# Patient Record
Sex: Male | Born: 1980 | Race: Black or African American | Hispanic: No | Marital: Single | Smoking: Current every day smoker
Health system: Southern US, Community
[De-identification: ages and names within clinical notes are randomized; demographics above are authoritative.]

---

## 2018-12-08 ENCOUNTER — Other Ambulatory Visit: Payer: Self-pay | Admitting: Infectious Diseases

## 2018-12-08 DIAGNOSIS — K5720 Diverticulitis of large intestine with perforation and abscess without bleeding: Secondary | ICD-10-CM

## 2018-12-10 ENCOUNTER — Ambulatory Visit
Admission: RE | Admit: 2018-12-10 | Discharge: 2018-12-10 | Disposition: A | Discharge status: Home / Self Care | Payer: Self-pay | Source: Ambulatory Visit | Attending: Infectious Diseases | Admitting: Infectious Diseases

## 2018-12-10 ENCOUNTER — Other Ambulatory Visit: Payer: Self-pay | Admitting: Infectious Diseases

## 2018-12-10 ENCOUNTER — Other Ambulatory Visit: Payer: Self-pay

## 2018-12-10 DIAGNOSIS — K5720 Diverticulitis of large intestine with perforation and abscess without bleeding: Secondary | ICD-10-CM

## 2018-12-10 MED ORDER — IOPAMIDOL (ISOVUE-300) INJECTION 61%
100.0000 mL | Freq: Once | INTRAVENOUS | Status: AC | PRN
  Administered 2018-12-10: 100 mL via INTRAVENOUS

## 2018-12-10 NOTE — Progress Notes (Signed)
Referring Physician(s): Bhusal,Yogesh  Chief Complaint: The patient is seen in follow up today s/p diverticular abscess  History of present illness: Jason Gay is 38 year old male with past medical history of recurrent diverticulitis.  Patient is s/p drain placement 2/24 for acute diverticulitis which was subsequently removed 10/31/18 after imaging showed resolve of fluid collection.  He returned to the ED several days later with return of symptoms and underwent LLQ drain placement 11/21/18.  He was discharged home on PO and IV abx. He presented to IR drain clinic today for drain evaluation.  Patient states he feels better since discharge.  He has been able to eat and drink per his usual.  He is having regular bowel movement. He states he initially had daily output which has decreased significantly. He has not had any output for the past few days. He is otherwise without complaint today.   No past medical history on file.  Allergies: Patient has no allergy information on record.  Medications: Prior to Admission medications   Not on File     No family history on file.  Social History   Socioeconomic History  . Marital status: Single    Spouse name: Not on file  . Number of children: Not on file  . Years of education: Not on file  . Highest education level: Not on file  Occupational History  . Not on file  Social Needs  . Financial resource strain: Not on file  . Food insecurity:    Worry: Not on file    Inability: Not on file  . Transportation needs:    Medical: Not on file    Non-medical: Not on file  Tobacco Use  . Smoking status: Not on file  Substance and Sexual Activity  . Alcohol use: Not on file  . Drug use: Not on file  . Sexual activity: Not on file  Lifestyle  . Physical activity:    Days per week: Not on file    Minutes per session: Not on file  . Stress: Not on file  Relationships  . Social connections:    Talks on phone: Not on file    Gets  together: Not on file    Attends religious service: Not on file    Active member of club or organization: Not on file    Attends meetings of clubs or organizations: Not on file    Relationship status: Not on file  Other Topics Concern  . Not on file  Social History Narrative  . Not on file     Vital Signs: BP 132/81   Pulse 74   Temp 98.6 F (37 C)   SpO2 100%   Physical Exam  NAD, alert, anxious Abdomen: LLQ drain in place. Insertion site c/d/i.  No output in bulb.   Imaging: No results found.  Labs:  CBC: No results for input(s): WBC, HGB, HCT, PLT in the last 8760 hours.  COAGS: No results for input(s): INR, APTT in the last 8760 hours.  BMP: No results for input(s): NA, K, CL, CO2, GLUCOSE, BUN, CALCIUM, CREATININE, GFRNONAA, GFRAA in the last 8760 hours.  Invalid input(s): CMP  LIVER FUNCTION TESTS: No results for input(s): BILITOT, AST, ALT, ALKPHOS, PROT, ALBUMIN in the last 8760 hours.  Assessment: Diverticular abscess s/p LLQ drain placement 4/10 Patient presents to IR drain clinic today for evaluation of his LLQ drain.  Report initially had several mLs of drainage daily, now with no output for the past  several days.  CT Abdomen/Pelvis performed today and reviewed by Dr. Deanne CofferHassell who notes continued collection.  Tubing irrigated by PA today with return of purulent material.  Patient educated on flushing which was demonstrated by PA today. Given prescription and instructions to continue flushing BID per Dr. Deanne CofferHassell.  Patient to return in 3 weeks for repeat CT imaging with possible injection.  Patient verbalizes understanding.  He is encouraged to keep all follow-up appointment.  He believes he may have an appointment with surgeon at [facility] for possible colectomy in the future.   Signed: Hoyt KochKacie Sue-Ellen Aerik Polan, PA 12/10/2018, 11:52 AM   Please refer to Dr. Deanne CofferHassell attestation of this note for management and plan.

## 2018-12-31 ENCOUNTER — Other Ambulatory Visit: Payer: Self-pay

## 2018-12-31 ENCOUNTER — Encounter: Payer: Self-pay | Admitting: Radiology

## 2018-12-31 ENCOUNTER — Ambulatory Visit
Admission: RE | Admit: 2018-12-31 | Discharge: 2018-12-31 | Disposition: A | Discharge status: Home / Self Care | Payer: Self-pay | Source: Ambulatory Visit | Attending: Infectious Diseases | Admitting: Infectious Diseases

## 2018-12-31 DIAGNOSIS — K5720 Diverticulitis of large intestine with perforation and abscess without bleeding: Secondary | ICD-10-CM

## 2018-12-31 HISTORY — PX: IR RADIOLOGIST EVAL & MGMT: IMG5224

## 2018-12-31 MED ORDER — IOPAMIDOL (ISOVUE-300) INJECTION 61%
100.0000 mL | Freq: Once | INTRAVENOUS | Status: AC | PRN
  Administered 2018-12-31: 100 mL via INTRAVENOUS

## 2018-12-31 NOTE — Progress Notes (Signed)
Chief Complaint: F/U drain  Referring Physician(s): Bhusal,Yogesh  Supervising Physician: Gilmer Mor  History of Present Illness: Jason Gay is a 38 y.o. male with past medical history of recurrent diverticulitis.    He is s/p drain placement 2/24 for acute diverticulitis which was subsequently removed 10/31/18 after imaging showed resolve of fluid collection.    He returned to the ED several days later with return of symptoms and underwent LLQ drain placement 11/21/18.    He was seen by Dr. Rubye Oaks with General Surgery in [facility] who recommended a Hartman's procedure, however the patient refused this twice as he did not wish to have a colostomy.  He was discharged home on PO and IV abx.   He is waiting on a referral to Dr. Byrd Hesselbach at [facility] for definitive surgery. The hold up his his Medicaid approval.  He was seen in IR drain clinic on 4/29 and CT scan at that time showed persistent fluid collection.  He was instructed to continue flushing BID. He has been flushing only daily though because he states he "doesn't like the way it feels".  He is here today for another CT scan and drain injection.   No nausea/vomiting. No Fever/chills. ROS negative.  He is still taking antibiotics.  He is extremely jumpy and nervous and has a very low pain tolerance.  No past medical history on file.  Allergies: Patient has no allergy information on record.  Medications: Prior to Admission medications   Not on File     No family history on file.  Social History   Socioeconomic History  . Marital status: Single    Spouse name: Not on file  . Number of children: Not on file  . Years of education: Not on file  . Highest education level: Not on file  Occupational History  . Not on file  Social Needs  . Financial resource strain: Not on file  . Food insecurity:    Worry: Not on file    Inability: Not on file  . Transportation needs:    Medical: Not on file   Non-medical: Not on file  Tobacco Use  . Smoking status: Not on file  Substance and Sexual Activity  . Alcohol use: Not on file  . Drug use: Not on file  . Sexual activity: Not on file  Lifestyle  . Physical activity:    Days per week: Not on file    Minutes per session: Not on file  . Stress: Not on file  Relationships  . Social connections:    Talks on phone: Not on file    Gets together: Not on file    Attends religious service: Not on file    Active member of club or organization: Not on file    Attends meetings of clubs or organizations: Not on file    Relationship status: Not on file  Other Topics Concern  . Not on file  Social History Narrative  . Not on file     Review of Systems: A 12 point ROS discussed and pertinent positives are indicated in the HPI above.  All other systems are negative.  Review of Systems  Physical Exam Awake and alert NAD Abdomen soft Drain in place. Only  Minimal clearish output, appears to be c/w flush.    Imaging: Ct Abdomen Pelvis W Contrast  Result Date: 12/10/2018 CLINICAL DATA:  Diverticular abscess, post drain catheter placement 10/06/2018. After resolution of the abscess cavity on CT, and  no fistula on injection, the catheter was removed 10/31/2018. He returned 11/18/2018 with acute left pain, CT demonstrating reaccumulation of pericolonic left abscess. Drain catheter placed 11/21/2018. He presents for follow-up evaluation. EXAM: CT ABDOMEN AND PELVIS WITH CONTRAST TECHNIQUE: Multidetector CT imaging of the abdomen and pelvis was performed using the standard protocol following bolus administration of intravenous contrast. CONTRAST:  100mL ISOVUE-300 IOPAMIDOL (ISOVUE-300) INJECTION 61% COMPARISON:  11/18/2018 FINDINGS: Lower chest: No pleural or pericardial effusion. Visualized lung bases clear. Hepatobiliary: No focal liver abnormality is seen. No gallstones, gallbladder wall thickening, or biliary dilatation. Pancreas: Unremarkable.  No pancreatic ductal dilatation or surrounding inflammatory changes. Spleen: Normal in size without focal abnormality. Adrenals/Urinary Tract: Adrenal glands are unremarkable. Kidneys are normal, without renal calculi, focal lesion, or hydronephrosis. Bladder is unremarkable. Stomach/Bowel: Stomach and small bowel are nondistended. Normal appendix. The colon is nondilated. Multiple distal descending and sigmoid diverticula with regional inflammatory/edematous changes. Pigtail drain catheter in left psoas/pericolonic abscess containing gas and fluid, 2.4 cm diameter, previously 2.6. No new or undrained component. Vascular/Lymphatic: No significant vascular findings are present. No enlarged abdominal or pelvic lymph nodes. Reproductive: Prominent prostate. Other: No ascites. No free air. Musculoskeletal: No acute or significant osseous findings. IMPRESSION: 1. Sigmoid diverticulitis with some Interval decrease in size of left psoas/pericolonic abscess post percutaneous drain catheter placement. No new or undrained components. Electronically Signed   By: Corlis Leak  Hassell M.D.   On: 12/10/2018 13:29    Labs:  CBC: No results for input(s): WBC, HGB, HCT, PLT in the last 8760 hours.  COAGS: No results for input(s): INR, APTT in the last 8760 hours.  BMP: No results for input(s): NA, K, CL, CO2, GLUCOSE, BUN, CALCIUM, CREATININE, GFRNONAA, GFRAA in the last 8760 hours.  Invalid input(s): CMP  LIVER FUNCTION TESTS: No results for input(s): BILITOT, AST, ALT, ALKPHOS, PROT, ALBUMIN in the last 8760 hours.  TUMOR MARKERS: No results for input(s): AFPTM, CEA, CA199, CHROMGRNA in the last 8760 hours.  Assessment/Plan:  Diverticular abscess s/p LLQ drain placement 4/10.  Refused Hartmans procedure offered by Dr. Rubye OaksPalmer and is waiting on referral to Dr[facility]aters at [facility].  CT scan reviewed today showed resolution of the abscess cavity and drain injection showed NO fistula connection to the bowel.  Since  patient is waiting on Medicaid and does not have an appointment yet with Dr. Byrd HesselbachWaters, the decision was made to remove the drain today. It was removed without difficulty.  Again, the patient was very nervous and worried he had "internal bleeding" after the drain was removed.  I reassured the patient he would be fine. I educated him on signs and symptoms of recurrent abscess development and he understands. He will finish his antibiotics.  I instructed him he could remove the dressing in 24-48 hours and shower, cover with Band-Aid if needed.  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   Electronically Signed: Gwynneth MacleodWENDY S BLAIR PA-C 12/31/2018, 9:32 AM    Please refer to Dr. Kenna GilbertWagner's attestation of this note for management and plan.

## 2021-03-25 IMAGING — RF ABSCESS INJECTION
3 series · 9 of 9 positions shown · non-contrast
Comparison: none

INDICATION: 37-year-old male with a history of abscess secondary to
diverticulitis

[Series 1: sequence · 4 of 69 frames shown (1 of 2)]
[frame 1/69]
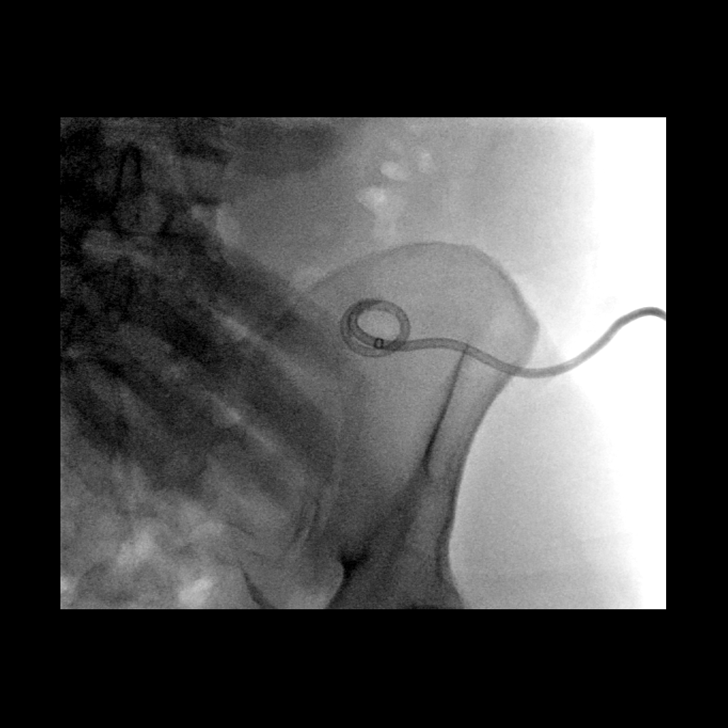
[frame 11/69]
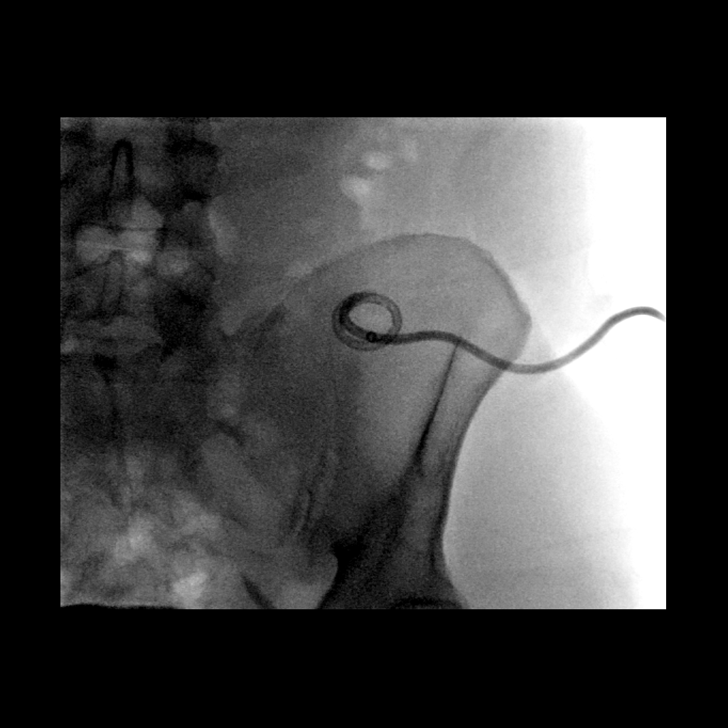
[frame 35/69]
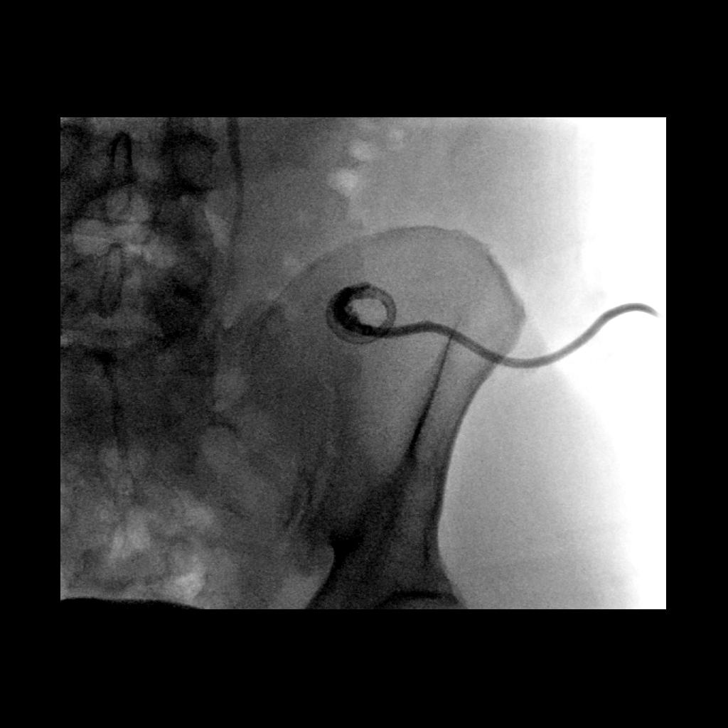
[frame 59/69]
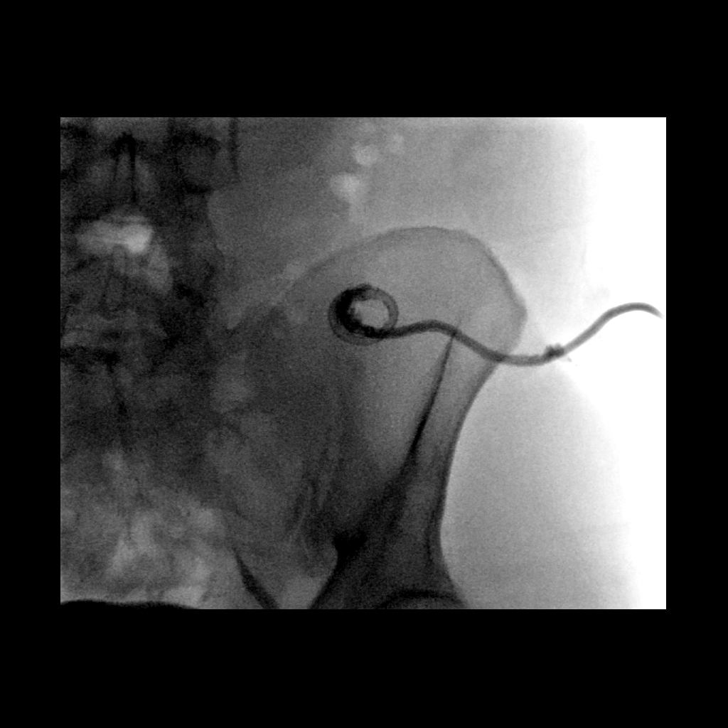

[Series 2: sequence · 4 of 21 frames shown (2 of 2)]
[frame 4/21]
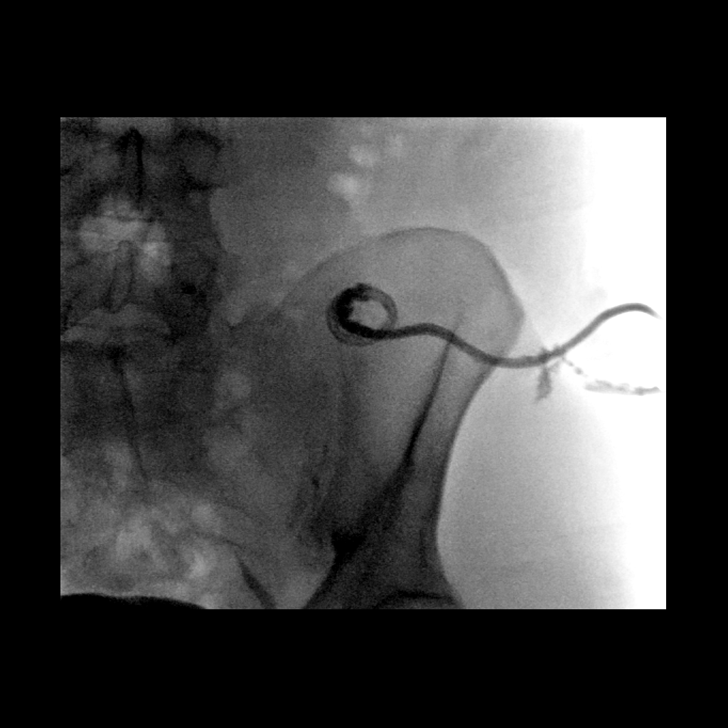
[frame 8/21]
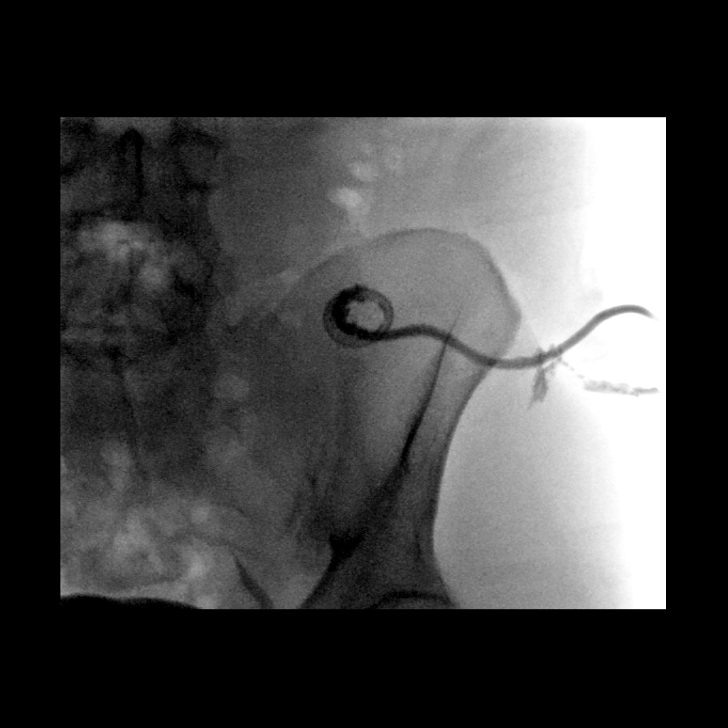
[frame 11/21]
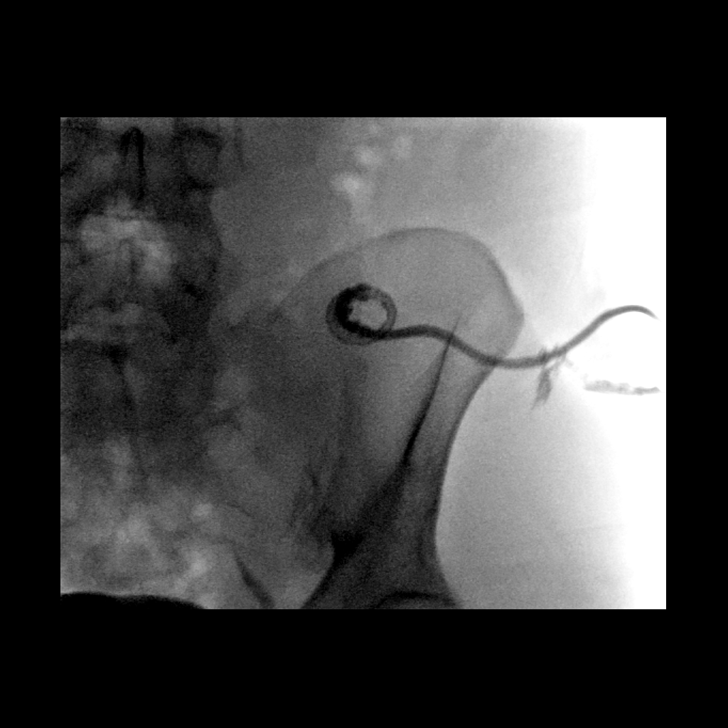
[frame 18/21]
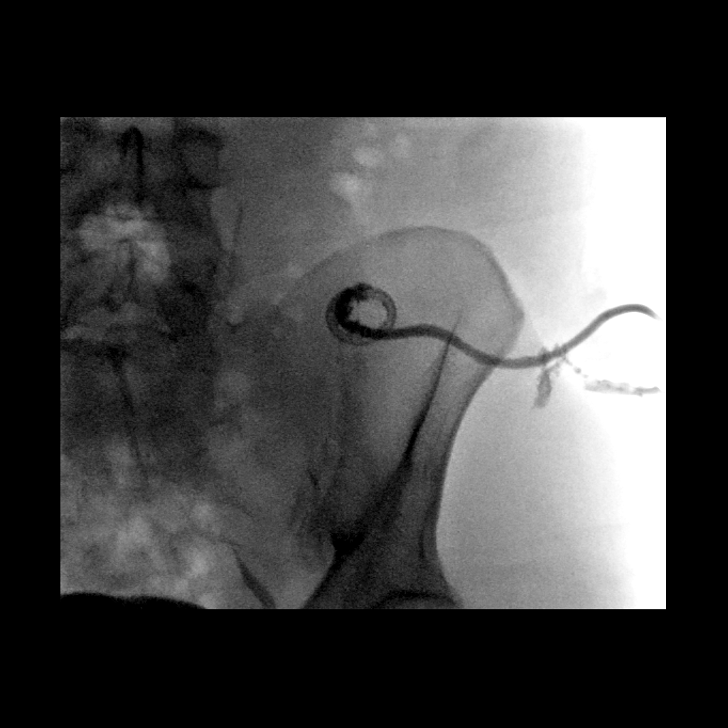

[Series 3: one shot · 0.15mm/px · 1 of 1 slices shown]
[im 1/1]
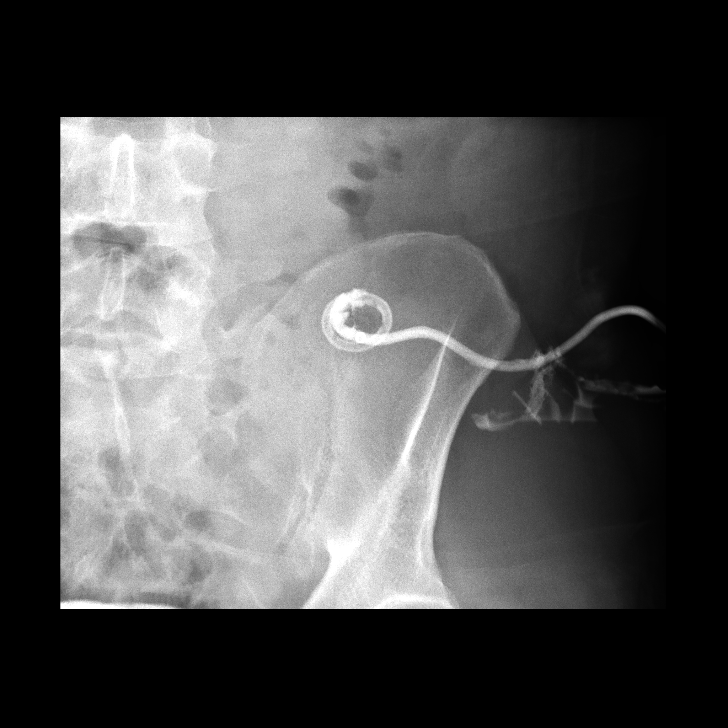

[9 of 9 positions shown; findings below may reference images not displayed]

EXAM:
IMAGE GUIDED DRAIN INJECTION/INTERROGATION

MEDICATIONS:
The patient is currently admitted to the hospital and receiving
intravenous antibiotics. The antibiotics were administered within an
appropriate time frame prior to the initiation of the procedure.

ANESTHESIA/SEDATION:
None

COMPLICATIONS:
None

PROCEDURE:
Informed written consent was obtained from the patient after a
thorough discussion of the procedural risks, benefits and
alternatives. All questions were addressed. Maximal Sterile Barrier
Technique was utilized including caps, mask, sterile gowns, sterile
gloves, sterile drape, hand hygiene and skin antiseptic. A timeout
was performed prior to the initiation of the procedure.

Patient positioned supine position on the fluoroscopy table. Scout
images were acquired.

Gentle injection was performed.

No residual abscess cavity with reflux of contrast through the skin.

No evidence of fistula to the colon.

Drain was removed at the bedside.
IMPRESSION: Drain injection of left pelvic abscess pigtail catheter demonstrates
no fistula and no residual abscess. Drain was removed.

## 2021-03-25 IMAGING — CT CT ABDOMEN AND PELVIS WITH CONTRAST
2 of 4 series · 14 of 46 positions shown, 16 images · IV contrast (iopamidol)
Comparison: None.

CLINICAL DATA: 37-year-old male with a history of diverticulitis,
with first episodes diagnosed in late [id number].
TECHNIQUE: Multidetector CT imaging of the abdomen and pelvis was performed
using the standard protocol following bolus administration of
intravenous contrast.

CONTRAST:  100mL [id number] IOPAMIDOL ([id number]) INJECTION 61%

[Series 2: abd pelvis 5.00 br40 s3 ax · axial · 0.66mm/px · z∈[+1038,+1408]mm · 11 of 90 slices shown, 13 images]
[im 8/90  soft-tissue]
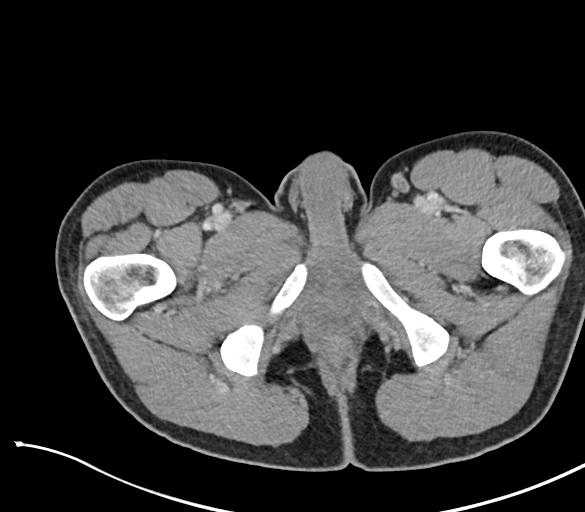
[im 8/90  bone]
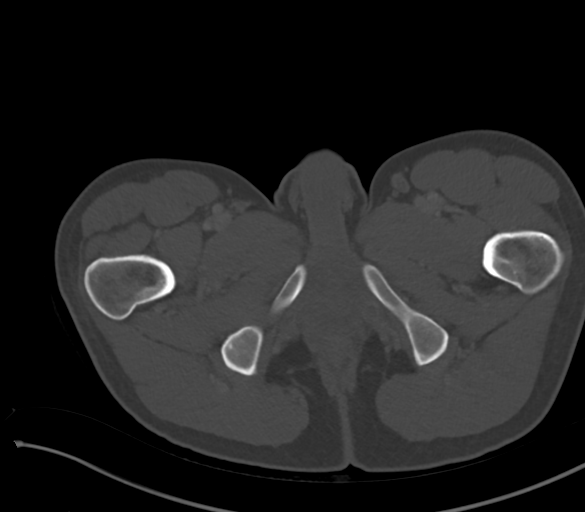
[im 15/90  soft-tissue]
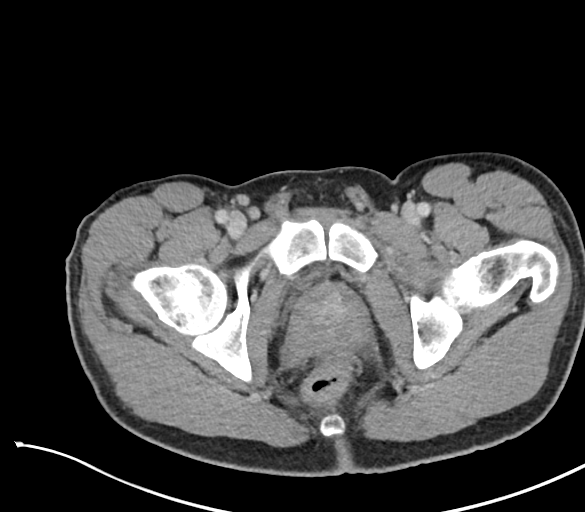
[im 22/90  soft-tissue]
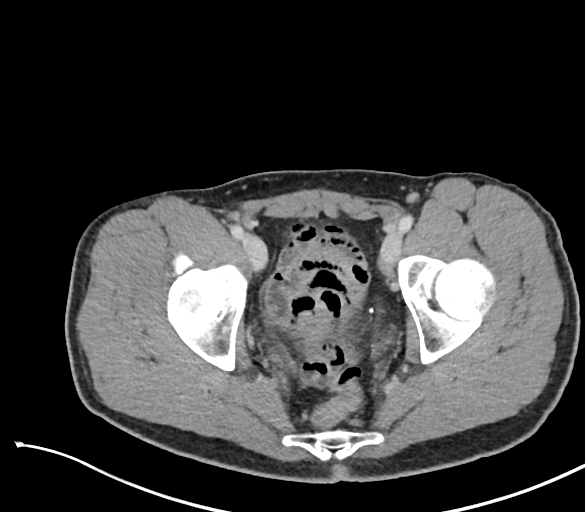
[im 29/90  soft-tissue]
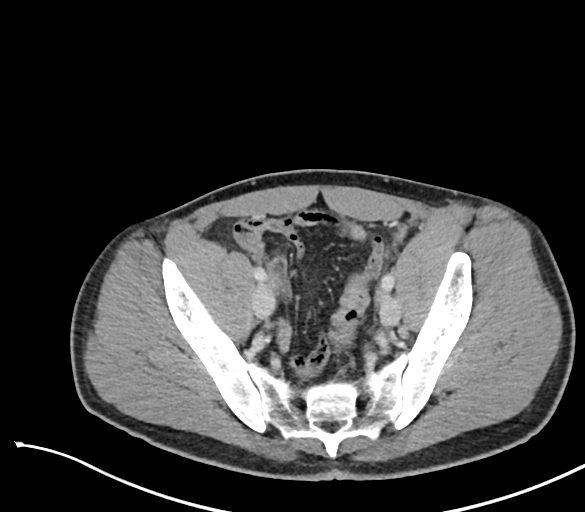
[im 36/90  soft-tissue]
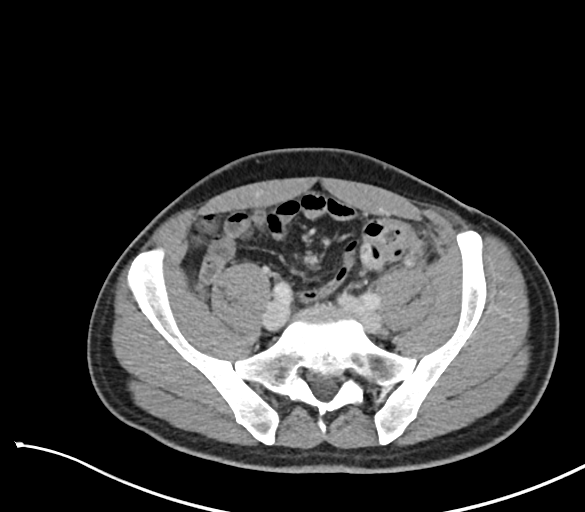
[im 47/90  soft-tissue]
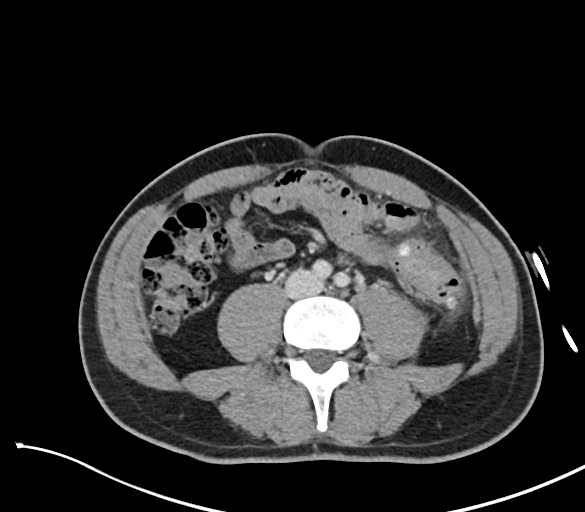
[im 54/90  soft-tissue]
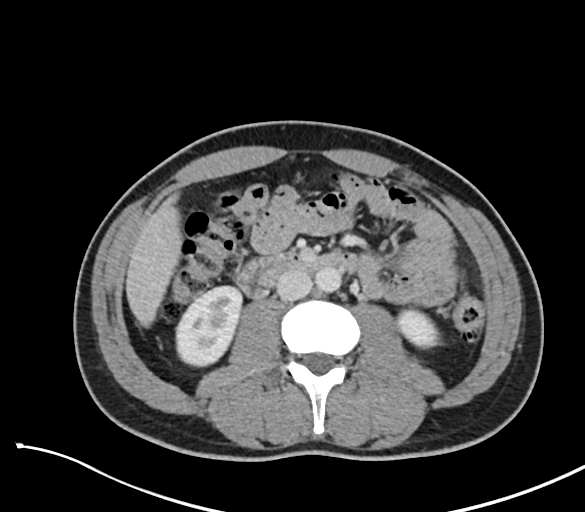
[im 61/90  soft-tissue]
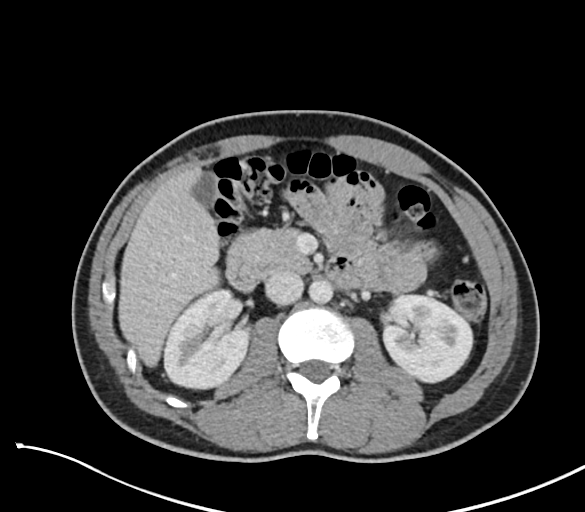
[im 68/90  soft-tissue]
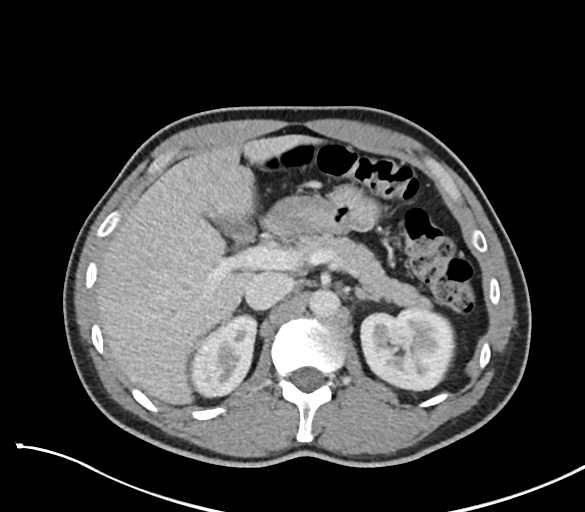
[im 68/90  bone]
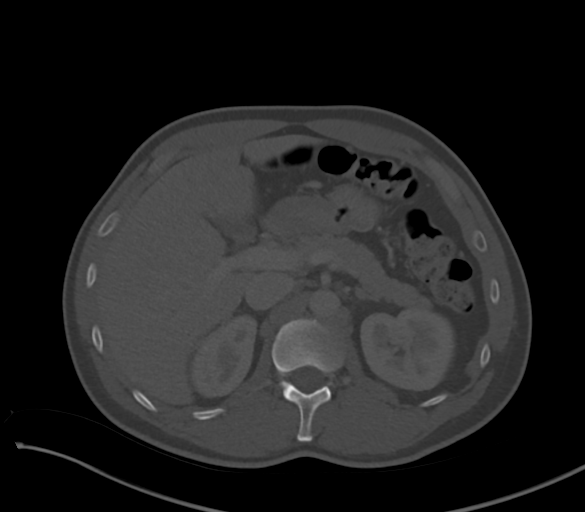
[im 75/90  soft-tissue]
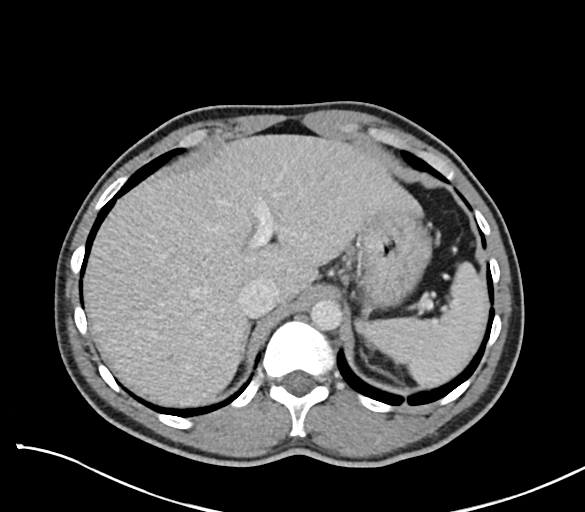
[im 82/90  soft-tissue]
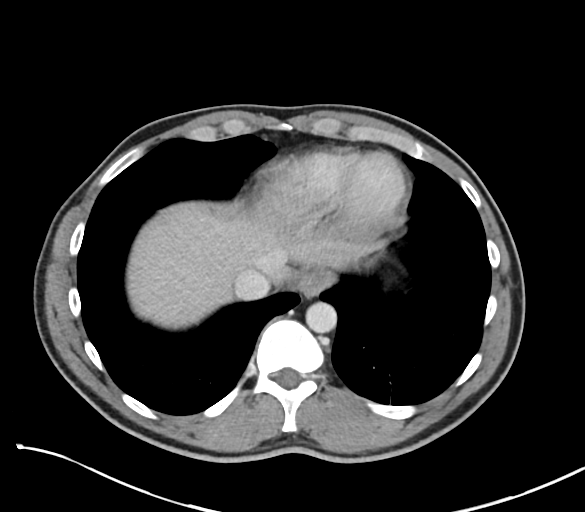

[Series 6: abd pelvis 2.00 br40 s3 cor · coronal · 0.71mm/px · 3 of 136 slices shown]
[im 46/136  soft-tissue]
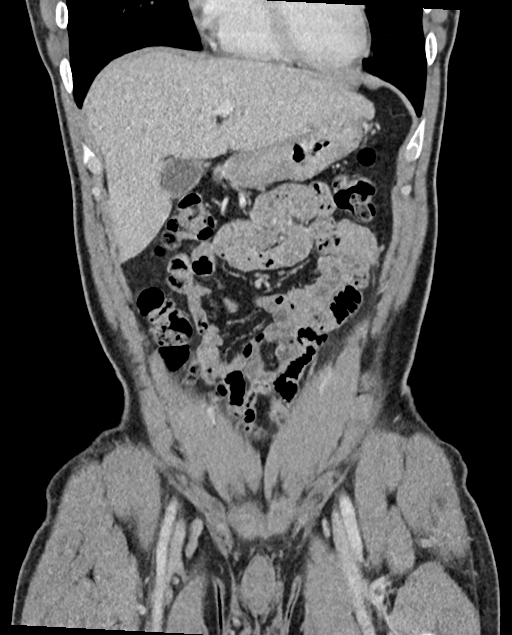
[im 61/136  soft-tissue]
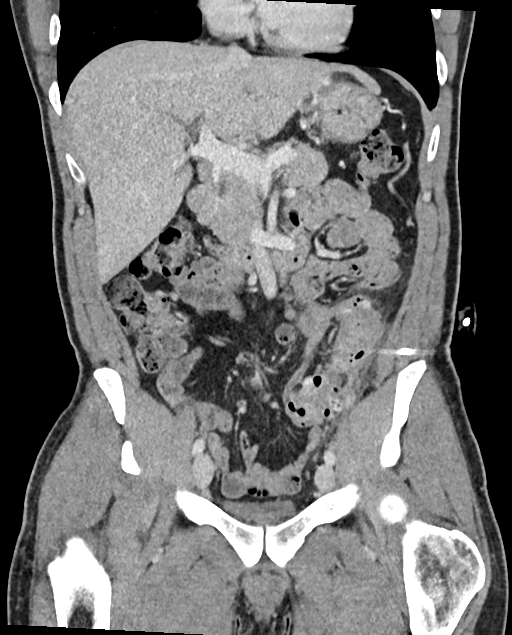
[im 76/136  soft-tissue]
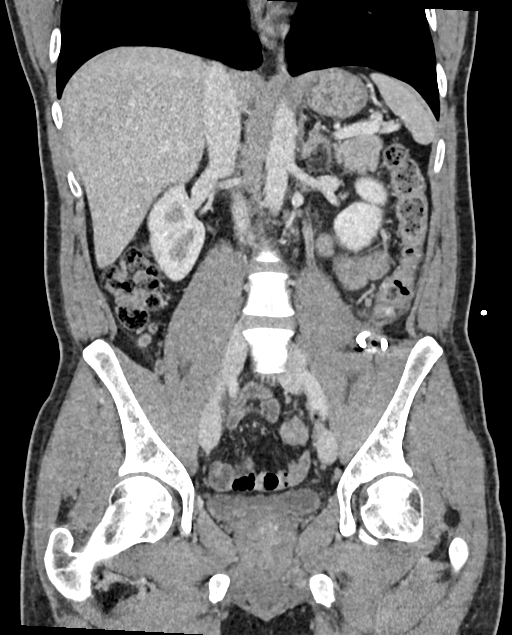

[14 of 46 positions shown; findings below may reference images not displayed]

The patient has had abscess of the left anti mesenteric aspect of
sigmoid colon, overlying the left psoas muscle, with at least 2
percutaneous drain placements performed in the past.

He returns today for evaluation.

He has not yet established outpatient surgical care, only inpatient
surgical opinions for episodes of acute diverticulitis. Hospitalist
notes from [facility]] note that the patient has refused
recommended surgical plan of colectomy, ostomy, Miryan Kwan and
interval reversal.

EXAM:
CT ABDOMEN AND PELVIS WITH CONTRAST
FINDINGS: Lower chest: No acute finding of the lower chest

Hepatobiliary: Unremarkable liver.  Unremarkable gallbladder.

Pancreas: Unremarkable

Spleen: Unremarkable

Adrenals/Urinary Tract: Unremarkable appearance of the adrenal
glands. No evidence of hydronephrosis of the right or left kidney.
No nephrolithiasis. Unremarkable course of the bilateral ureters.
Unremarkable appearance of the urinary bladder.

Stomach/Bowel: Stomach unremarkable. Small bowel unremarkable with
no transition point or focal wall thickening.

Normal appendix.

Mild to moderate stool burden.

Diverticular disease of the left colon. There is circumferential
wall thickening of the descending colon in the region of the
previously demonstrated acute diverticulitis. Minimal inflammatory
changes are present within the fat at this site with small lymph
nodes within the fat.

Pigtail drainage catheter terminates in the region of the prior
abscess at the psoas muscle, anti mesenteric aspect of the
descending colon. There is no residual fluid collection at the
abscess site or the drain site.

Vascular/Lymphatic: Scant calcifications of the abdominal aorta.

Reproductive: Unremarkable appearance of the pelvic structures

Other: None

Musculoskeletal: No acute displaced fracture. No significant
degenerative changes.
IMPRESSION: Redemonstration of pigtail drainage catheter in the left pelvis, on
the anti mesenteric aspect of the descending colon overlying the
psoas. There is no residual abscess, with trace residual
inflammatory changes of both sigmoid colon and the fat, potentially
secondary to sterile drain injection versus residual inflammation.

Diverticular changes of the left colon again demonstrated, with
reduced inflammation compared to prior CT scans.

## 2021-10-13 ENCOUNTER — Other Ambulatory Visit: Payer: Self-pay

## 2021-10-13 ENCOUNTER — Encounter (HOSPITAL_BASED_OUTPATIENT_CLINIC_OR_DEPARTMENT_OTHER): Payer: Self-pay | Admitting: *Deleted

## 2021-10-13 ENCOUNTER — Emergency Department (HOSPITAL_BASED_OUTPATIENT_CLINIC_OR_DEPARTMENT_OTHER)
Admission: EM | Admit: 2021-10-13 | Discharge: 2021-10-13 | Discharge status: Against Medical Advice | Payer: Medicaid Other | Attending: Emergency Medicine | Admitting: Emergency Medicine

## 2021-10-13 DIAGNOSIS — R109 Unspecified abdominal pain: Secondary | ICD-10-CM | POA: Insufficient documentation

## 2021-10-13 DIAGNOSIS — Z5321 Procedure and treatment not carried out due to patient leaving prior to being seen by health care provider: Secondary | ICD-10-CM | POA: Insufficient documentation

## 2021-10-13 DIAGNOSIS — Z933 Colostomy status: Secondary | ICD-10-CM | POA: Insufficient documentation

## 2021-10-13 DIAGNOSIS — R222 Localized swelling, mass and lump, trunk: Secondary | ICD-10-CM | POA: Diagnosis not present

## 2021-10-13 DIAGNOSIS — J029 Acute pharyngitis, unspecified: Secondary | ICD-10-CM | POA: Diagnosis present

## 2021-10-13 LAB — CBC WITH DIFFERENTIAL/PLATELET
Abs Immature Granulocytes: 0.03 10*3/uL (ref 0.00–0.07)
Basophils Absolute: 0 10*3/uL (ref 0.0–0.1)
Basophils Relative: 0 %
Eosinophils Absolute: 0.3 10*3/uL (ref 0.0–0.5)
Eosinophils Relative: 3 %
HCT: 41.9 % (ref 39.0–52.0)
Hemoglobin: 14.5 g/dL (ref 13.0–17.0)
Immature Granulocytes: 0 %
Lymphocytes Relative: 37 %
Lymphs Abs: 4.1 10*3/uL — ABNORMAL HIGH (ref 0.7–4.0)
MCH: 32 pg (ref 26.0–34.0)
MCHC: 34.6 g/dL (ref 30.0–36.0)
MCV: 92.5 fL (ref 80.0–100.0)
Monocytes Absolute: 0.8 10*3/uL (ref 0.1–1.0)
Monocytes Relative: 7 %
Neutro Abs: 5.8 10*3/uL (ref 1.7–7.7)
Neutrophils Relative %: 53 %
Platelets: 250 10*3/uL (ref 150–400)
RBC: 4.53 MIL/uL (ref 4.22–5.81)
RDW: 12.8 % (ref 11.5–15.5)
WBC: 11 10*3/uL — ABNORMAL HIGH (ref 4.0–10.5)
nRBC: 0 % (ref 0.0–0.2)

## 2021-10-13 LAB — URINALYSIS, ROUTINE W REFLEX MICROSCOPIC
Bilirubin Urine: NEGATIVE
Glucose, UA: NEGATIVE mg/dL
Ketones, ur: NEGATIVE mg/dL
Leukocytes,Ua: NEGATIVE
Nitrite: NEGATIVE
Protein, ur: NEGATIVE mg/dL
Specific Gravity, Urine: 1.025 (ref 1.005–1.030)
pH: 7 (ref 5.0–8.0)

## 2021-10-13 LAB — COMPREHENSIVE METABOLIC PANEL
ALT: 33 U/L (ref 0–44)
AST: 37 U/L (ref 15–41)
Albumin: 3.8 g/dL (ref 3.5–5.0)
Alkaline Phosphatase: 75 U/L (ref 38–126)
Anion gap: 9 (ref 5–15)
BUN: 12 mg/dL (ref 6–20)
CO2: 29 mmol/L (ref 22–32)
Calcium: 8.8 mg/dL — ABNORMAL LOW (ref 8.9–10.3)
Chloride: 100 mmol/L (ref 98–111)
Creatinine, Ser: 1.08 mg/dL (ref 0.61–1.24)
GFR, Estimated: >60 mL/min (ref >60)
Glucose, Bld: 102 mg/dL — ABNORMAL HIGH (ref 70–99)
Potassium: 3.6 mmol/L (ref 3.5–5.1)
Sodium: 138 mmol/L (ref 135–145)
Total Bilirubin: 0.4 mg/dL (ref 0.3–1.2)
Total Protein: 7.3 g/dL (ref 6.5–8.1)

## 2021-10-13 LAB — URINALYSIS, MICROSCOPIC (REFLEX): WBC, UA: NONE SEEN WBC/hpf (ref 0–5)

## 2021-10-13 LAB — GROUP A STREP BY PCR: Group A Strep by PCR: NOT DETECTED

## 2021-10-13 NOTE — ED Triage Notes (Signed)
Sore throat and abdominal pain. States he has a colostomy bag and he has pain and swelling at the site.  ?
